# Patient Record
Sex: Male | Born: 1990 | Race: White | Hispanic: No | Marital: Single | Smoking: Current some day smoker
Health system: Southern US, Community
[De-identification: ages and names within clinical notes are randomized; demographics above are authoritative.]

## PROBLEM LIST (undated history)

## (undated) DIAGNOSIS — S31119A Laceration without foreign body of abdominal wall, unspecified quadrant without penetration into peritoneal cavity, initial encounter: Secondary | ICD-10-CM

## (undated) DIAGNOSIS — F319 Bipolar disorder, unspecified: Secondary | ICD-10-CM

## (undated) HISTORY — PX: APPENDECTOMY: SHX54

## (undated) HISTORY — PX: HAND SURGERY: SHX662

## (undated) HISTORY — PX: OTHER SURGICAL HISTORY: SHX169

## (undated) HISTORY — PX: EXPLORATORY LAPAROTOMY: SUR591

---

## 2013-12-26 ENCOUNTER — Emergency Department (HOSPITAL_BASED_OUTPATIENT_CLINIC_OR_DEPARTMENT_OTHER)
Admission: EM | Admit: 2013-12-26 | Discharge: 2013-12-27 | Disposition: A | Discharge status: Home / Self Care | Payer: Self-pay | Attending: Emergency Medicine | Admitting: Emergency Medicine

## 2013-12-26 ENCOUNTER — Encounter (HOSPITAL_BASED_OUTPATIENT_CLINIC_OR_DEPARTMENT_OTHER): Payer: Self-pay | Admitting: Emergency Medicine

## 2013-12-26 DIAGNOSIS — R6889 Other general symptoms and signs: Secondary | ICD-10-CM

## 2013-12-26 DIAGNOSIS — IMO0001 Reserved for inherently not codable concepts without codable children: Secondary | ICD-10-CM | POA: Insufficient documentation

## 2013-12-26 DIAGNOSIS — J111 Influenza due to unidentified influenza virus with other respiratory manifestations: Secondary | ICD-10-CM | POA: Insufficient documentation

## 2013-12-26 MED ORDER — ACETAMINOPHEN 325 MG PO TABS
650.0000 mg | ORAL_TABLET | Freq: Once | ORAL | Status: AC
  Administered 2013-12-26: 650 mg via ORAL
  Filled 2013-12-26: qty 2

## 2013-12-26 NOTE — ED Notes (Addendum)
Fever, cough, nasal congestion, headache, brown sputum and body aches x 3 days. Last Tylenol was 3.5 hours ago.

## 2013-12-27 MED ORDER — HYDROCOD POLST-CHLORPHEN POLST 10-8 MG/5ML PO LQCR
5.0000 mL | Freq: Once | ORAL | Status: AC
  Administered 2013-12-27: 5 mL via ORAL
  Filled 2013-12-27: qty 5

## 2013-12-27 MED ORDER — HYDROCOD POLST-CHLORPHEN POLST 10-8 MG/5ML PO LQCR: 5.0000 mL | Freq: Two times a day (BID) | ORAL | Status: DC | PRN

## 2013-12-27 NOTE — ED Provider Notes (Signed)
CSN: [id number]     Arrival date & time 12/26/13  2050 History   First MD Initiated Contact with Patient 12/27/13 0016     Chief Complaint  Patient presents with  . Fever      Patient is a 23 y.o. male presenting with flu symptoms. The history is provided by the patient.  Influenza Presenting symptoms: cough, diarrhea, fatigue, fever, headache, myalgias and sore throat   Presenting symptoms: no shortness of breath and no vomiting   Severity:  Moderate Onset quality:  Gradual Duration:  3 days Progression:  Worsening Chronicity:  New Relieved by:  Nothing Worsened by:  Nothing tried Associated symptoms: chills, decreased appetite and nasal congestion   Associated symptoms: no neck stiffness    No foreign travel reported   PMH - none Past Surgical History  Procedure Laterality Date  . Appendectomy     No family history on file. History  Substance Use Topics  . Smoking status: Never Smoker   . Smokeless tobacco: Not on file  . Alcohol Use: No    Review of Systems  Constitutional: Positive for fever, chills, fatigue and decreased appetite.  HENT: Positive for congestion and sore throat.   Respiratory: Positive for cough. Negative for shortness of breath.   Gastrointestinal: Positive for diarrhea. Negative for vomiting.  Musculoskeletal: Positive for myalgias. Negative for neck stiffness.  Neurological: Positive for headaches. Negative for weakness.  All other systems reviewed and are negative.      Allergies  Review of patient's allergies indicates no known allergies.  Home Medications   Current Outpatient Rx  Name  Route  Sig  Dispense  Refill  . chlorpheniramine-HYDROcodone (TUSSIONEX PENNKINETIC ER) 10-8 MG/5ML LQCR   Oral   Take 5 mLs by mouth every 12 (twelve) hours as needed for cough.   480 mL   0    BP 122/76  Pulse 87  Temp(Src) 98.5 F (36.9 C) (Oral)  Resp 16  Ht 5\' 10"  (1.778 m)  Wt 210 lb (95.255 kg)  BMI 30.13 kg/m2  SpO2  98% Physical Exam CONSTITUTIONAL: Well developed/well nourished HEAD: Normocephalic/atraumatic EYES: EOMI/PERRL ENMT: Mucous membranes moist, nasal congestion, uvula midline, pharynx erythematous, normal phonation NECK: supple no meningeal signs SPINE:entire spine nontender CV: S1/S2 noted, no murmurs/rubs/gallops noted LUNGS: Lungs are clear to auscultation bilaterally, no apparent distress ABDOMEN: soft, nontender, no rebound or guarding GU:no cva tenderness NEURO: Pt is awake/alert, moves all extremitiesx4 EXTREMITIES: pulses normal, full ROM, no rash noted SKIN: warm, color normal PSYCH: no abnormalities of mood noted  ED Course  Procedures   Suspect influenza Lung sounds clear, defer imaging/labs Pt is well appearing nontoxic He reports HA only when he coughs, no signs of meningitis Advised need for rest/fluids, and will give tussionex for cough We discussed strict return precautions  Medications  acetaminophen (TYLENOL) tablet 650 mg (650 mg Oral Given 12/26/13 2104)  chlorpheniramine-HYDROcodone (TUSSIONEX) 10-8 MG/5ML suspension 5 mL (5 mLs Oral Given 12/27/13 0117)     MDM   Final diagnoses:  Flu-like symptoms    Nursing notes including past medical history and social history reviewed and considered in documentation     Joya Gaskinsonald W Eusevio Schriver, MD 12/27/13 [phone]

## 2016-05-30 ENCOUNTER — Encounter (HOSPITAL_BASED_OUTPATIENT_CLINIC_OR_DEPARTMENT_OTHER): Payer: Self-pay | Admitting: *Deleted

## 2016-05-30 ENCOUNTER — Emergency Department (HOSPITAL_BASED_OUTPATIENT_CLINIC_OR_DEPARTMENT_OTHER): Payer: Medicaid Other

## 2016-05-30 DIAGNOSIS — M7989 Other specified soft tissue disorders: Secondary | ICD-10-CM | POA: Insufficient documentation

## 2016-05-30 DIAGNOSIS — Z5321 Procedure and treatment not carried out due to patient leaving prior to being seen by health care provider: Secondary | ICD-10-CM | POA: Insufficient documentation

## 2016-05-30 LAB — BASIC METABOLIC PANEL
Anion gap: 7 (ref 5–15)
BUN: 8 mg/dL (ref 6–20)
CALCIUM: 8.6 mg/dL — AB (ref 8.9–10.3)
CO2: 26 mmol/L (ref 22–32)
CREATININE: 0.96 mg/dL (ref 0.61–1.24)
Chloride: 107 mmol/L (ref 101–111)
Glucose, Bld: 109 mg/dL — ABNORMAL HIGH (ref 65–99)
Potassium: 3.5 mmol/L (ref 3.5–5.1)
Sodium: 140 mmol/L (ref 135–145)

## 2016-05-30 LAB — CBC
HCT: 40.4 % (ref 39.0–52.0)
HEMOGLOBIN: 13.7 g/dL (ref 13.0–17.0)
MCH: 28.1 pg (ref 26.0–34.0)
MCHC: 33.9 g/dL (ref 30.0–36.0)
MCV: 83 fL (ref 78.0–100.0)
PLATELETS: 211 10*3/uL (ref 150–400)
RBC: 4.87 MIL/uL (ref 4.22–5.81)
RDW: 13.5 % (ref 11.5–15.5)
WBC: 9.1 10*3/uL (ref 4.0–10.5)

## 2016-05-30 LAB — TROPONIN I

## 2016-05-30 NOTE — ED Triage Notes (Signed)
Pt reports bilateral hand swelling, feet, and facial swelling. He also states chest tightness and shortness of breath.

## 2016-05-31 ENCOUNTER — Emergency Department (HOSPITAL_BASED_OUTPATIENT_CLINIC_OR_DEPARTMENT_OTHER)
Admission: EM | Admit: 2016-05-31 | Discharge: 2016-05-31 | Disposition: A | Discharge status: Home / Self Care | Payer: Medicaid Other | Attending: Dermatology | Admitting: Dermatology

## 2016-05-31 NOTE — ED Notes (Signed)
Called pt twice to bring back to room; no answer.

## 2016-07-23 ENCOUNTER — Encounter (HOSPITAL_BASED_OUTPATIENT_CLINIC_OR_DEPARTMENT_OTHER): Payer: Self-pay | Admitting: Emergency Medicine

## 2016-07-23 ENCOUNTER — Emergency Department (HOSPITAL_BASED_OUTPATIENT_CLINIC_OR_DEPARTMENT_OTHER): Payer: Medicaid Other

## 2016-07-23 ENCOUNTER — Emergency Department (HOSPITAL_BASED_OUTPATIENT_CLINIC_OR_DEPARTMENT_OTHER)
Admission: EM | Admit: 2016-07-23 | Discharge: 2016-07-24 | Disposition: A | Discharge status: Home / Self Care | Payer: Medicaid Other | Attending: Emergency Medicine | Admitting: Emergency Medicine

## 2016-07-23 DIAGNOSIS — Y939 Activity, unspecified: Secondary | ICD-10-CM | POA: Insufficient documentation

## 2016-07-23 DIAGNOSIS — T148 Other injury of unspecified body region: Secondary | ICD-10-CM | POA: Insufficient documentation

## 2016-07-23 DIAGNOSIS — Y999 Unspecified external cause status: Secondary | ICD-10-CM | POA: Diagnosis not present

## 2016-07-23 DIAGNOSIS — S53402A Unspecified sprain of left elbow, initial encounter: Secondary | ICD-10-CM | POA: Insufficient documentation

## 2016-07-23 DIAGNOSIS — S46912A Strain of unspecified muscle, fascia and tendon at shoulder and upper arm level, left arm, initial encounter: Secondary | ICD-10-CM

## 2016-07-23 DIAGNOSIS — Y929 Unspecified place or not applicable: Secondary | ICD-10-CM | POA: Diagnosis not present

## 2016-07-23 DIAGNOSIS — S59902A Unspecified injury of left elbow, initial encounter: Secondary | ICD-10-CM | POA: Diagnosis present

## 2016-07-23 DIAGNOSIS — T148XXA Other injury of unspecified body region, initial encounter: Secondary | ICD-10-CM
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
## 2016-07-24 NOTE — Discharge Instructions (Signed)
Your x-rays show no fracture or joint fluid.  Apply ice several times per day.  Tylenol or motrin for pain

## 2017-02-07 ENCOUNTER — Emergency Department (HOSPITAL_BASED_OUTPATIENT_CLINIC_OR_DEPARTMENT_OTHER): Payer: Medicaid Other

## 2017-02-07 ENCOUNTER — Encounter (HOSPITAL_BASED_OUTPATIENT_CLINIC_OR_DEPARTMENT_OTHER): Payer: Self-pay | Admitting: *Deleted

## 2017-02-07 ENCOUNTER — Inpatient Hospital Stay (HOSPITAL_BASED_OUTPATIENT_CLINIC_OR_DEPARTMENT_OTHER)
Admission: EM | Admit: 2017-02-07 | Discharge: 2017-02-10 | DRG: 603 | Disposition: A | Discharge status: Home / Self Care | Payer: Medicaid Other | Attending: Family Medicine | Admitting: Family Medicine

## 2017-02-07 DIAGNOSIS — L0390 Cellulitis, unspecified: Secondary | ICD-10-CM | POA: Diagnosis present

## 2017-02-07 DIAGNOSIS — L03115 Cellulitis of right lower limb: Secondary | ICD-10-CM | POA: Diagnosis present

## 2017-02-07 DIAGNOSIS — D649 Anemia, unspecified: Secondary | ICD-10-CM | POA: Diagnosis present

## 2017-02-07 DIAGNOSIS — E876 Hypokalemia: Secondary | ICD-10-CM | POA: Diagnosis present

## 2017-02-07 DIAGNOSIS — D72829 Elevated white blood cell count, unspecified: Secondary | ICD-10-CM | POA: Diagnosis present

## 2017-02-07 DIAGNOSIS — Z9049 Acquired absence of other specified parts of digestive tract: Secondary | ICD-10-CM

## 2017-02-07 DIAGNOSIS — Z8614 Personal history of Methicillin resistant Staphylococcus aureus infection: Secondary | ICD-10-CM

## 2017-02-07 HISTORY — DX: Laceration without foreign body of abdominal wall, unspecified quadrant without penetration into peritoneal cavity, initial encounter: S31.119A

## 2017-02-07 HISTORY — DX: Bipolar disorder, unspecified: F31.9

## 2017-02-07 LAB — CBC WITH DIFFERENTIAL/PLATELET
Basophils Absolute: 0 10*3/uL (ref 0.0–0.1)
Basophils Relative: 0 %
Eosinophils Absolute: 0.1 10*3/uL (ref 0.0–0.7)
Eosinophils Relative: 1 %
HCT: 38.5 % — ABNORMAL LOW (ref 39.0–52.0)
Hemoglobin: 13.1 g/dL (ref 13.0–17.0)
Lymphocytes Relative: 22 %
Lymphs Abs: 2.5 10*3/uL (ref 0.7–4.0)
MCH: 27 pg (ref 26.0–34.0)
MCHC: 34 g/dL (ref 30.0–36.0)
MCV: 79.4 fL (ref 78.0–100.0)
Monocytes Absolute: 1.2 10*3/uL — ABNORMAL HIGH (ref 0.1–1.0)
Monocytes Relative: 11 %
Neutro Abs: 7.7 10*3/uL (ref 1.7–7.7)
Neutrophils Relative %: 66 %
Platelets: 218 10*3/uL (ref 150–400)
RBC: 4.85 MIL/uL (ref 4.22–5.81)
RDW: 13.4 % (ref 11.5–15.5)
WBC: 11.5 10*3/uL — ABNORMAL HIGH (ref 4.0–10.5)

## 2017-02-07 LAB — BASIC METABOLIC PANEL
Anion gap: 9 (ref 5–15)
BUN: 6 mg/dL (ref 6–20)
CO2: 27 mmol/L (ref 22–32)
Calcium: 9.1 mg/dL (ref 8.9–10.3)
Chloride: 98 mmol/L — ABNORMAL LOW (ref 101–111)
Creatinine, Ser: 0.85 mg/dL (ref 0.61–1.24)
GFR calc Af Amer: >60 mL/min (ref >60)
GFR calc non Af Amer: >60 mL/min (ref >60)
Glucose, Bld: 95 mg/dL (ref 65–99)
Potassium: 3.1 mmol/L — ABNORMAL LOW (ref 3.5–5.1)
Sodium: 134 mmol/L — ABNORMAL LOW (ref 135–145)

## 2017-02-07 MED ORDER — VANCOMYCIN HCL IN DEXTROSE 1-5 GM/200ML-% IV SOLN
1000.0000 mg | Freq: Once | INTRAVENOUS | Status: AC
  Administered 2017-02-07: 1000 mg via INTRAVENOUS
  Filled 2017-02-07: qty 200

## 2017-02-07 MED ORDER — MORPHINE SULFATE (PF) 4 MG/ML IV SOLN
4.0000 mg | Freq: Once | INTRAVENOUS | Status: AC
  Administered 2017-02-07: 4 mg via INTRAVENOUS
  Filled 2017-02-07: qty 1

## 2017-02-07 MED ORDER — SODIUM CHLORIDE 0.9 % IV BOLUS (SEPSIS)
1000.0000 mL | Freq: Once | INTRAVENOUS | Status: AC
  Administered 2017-02-07: 1000 mL via INTRAVENOUS
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
## 2017-02-08 DIAGNOSIS — D72829 Elevated white blood cell count, unspecified: Secondary | ICD-10-CM | POA: Diagnosis present

## 2017-02-08 DIAGNOSIS — L03115 Cellulitis of right lower limb: Secondary | ICD-10-CM | POA: Diagnosis present

## 2017-02-08 DIAGNOSIS — E876 Hypokalemia: Secondary | ICD-10-CM | POA: Diagnosis present

## 2017-02-08 DIAGNOSIS — L0390 Cellulitis, unspecified: Secondary | ICD-10-CM | POA: Diagnosis present

## 2017-02-08 LAB — CBC WITH DIFFERENTIAL/PLATELET
BASOS PCT: 0 %
Basophils Absolute: 0 10*3/uL (ref 0.0–0.1)
EOS ABS: 0.2 10*3/uL (ref 0.0–0.7)
Eosinophils Relative: 2 %
HCT: 36.6 % — ABNORMAL LOW (ref 39.0–52.0)
HEMOGLOBIN: 12.1 g/dL — AB (ref 13.0–17.0)
Lymphocytes Relative: 32 %
Lymphs Abs: 2.8 10*3/uL (ref 0.7–4.0)
MCH: 25.9 pg — ABNORMAL LOW (ref 26.0–34.0)
MCHC: 33.1 g/dL (ref 30.0–36.0)
MCV: 78.2 fL (ref 78.0–100.0)
Monocytes Absolute: 0.9 10*3/uL (ref 0.1–1.0)
Monocytes Relative: 10 %
NEUTROS ABS: 4.8 10*3/uL (ref 1.7–7.7)
Neutrophils Relative %: 56 %
Platelets: 201 10*3/uL (ref 150–400)
RBC: 4.68 MIL/uL (ref 4.22–5.81)
RDW: 13.5 % (ref 11.5–15.5)
WBC: 8.6 10*3/uL (ref 4.0–10.5)

## 2017-02-08 LAB — BASIC METABOLIC PANEL
Anion gap: 8 (ref 5–15)
BUN: 7 mg/dL (ref 6–20)
CO2: 27 mmol/L (ref 22–32)
Calcium: 8.6 mg/dL — ABNORMAL LOW (ref 8.9–10.3)
Chloride: 101 mmol/L (ref 101–111)
Creatinine, Ser: 0.72 mg/dL (ref 0.61–1.24)
Glucose, Bld: 92 mg/dL (ref 65–99)
POTASSIUM: 3.4 mmol/L — AB (ref 3.5–5.1)
SODIUM: 136 mmol/L (ref 135–145)

## 2017-02-08 LAB — MRSA PCR SCREENING: MRSA by PCR: NEGATIVE

## 2017-02-08 MED ORDER — MORPHINE SULFATE (PF) 4 MG/ML IV SOLN
4.0000 mg | Freq: Once | INTRAVENOUS | Status: AC
  Administered 2017-02-08: 4 mg via INTRAVENOUS
  Filled 2017-02-08: qty 1

## 2017-02-08 MED ORDER — ONDANSETRON HCL 4 MG/2ML IJ SOLN: 4.0000 mg | Freq: Four times a day (QID) | INTRAMUSCULAR | Status: DC | PRN

## 2017-02-08 MED ORDER — PREMIER PROTEIN SHAKE
11.0000 [oz_av] | Freq: Two times a day (BID) | ORAL | Status: DC
  Administered 2017-02-08 – 2017-02-10 (×5): 11 [oz_av] via ORAL
  Filled 2017-02-08 (×6): qty 325.31

## 2017-02-08 MED ORDER — ENOXAPARIN SODIUM 40 MG/0.4ML ~~LOC~~ SOLN
40.0000 mg | SUBCUTANEOUS | Status: DC
  Administered 2017-02-08 – 2017-02-10 (×3): 40 mg via SUBCUTANEOUS
  Filled 2017-02-08 (×3): qty 0.4

## 2017-02-08 MED ORDER — POTASSIUM CHLORIDE CRYS ER 20 MEQ PO TBCR
20.0000 meq | EXTENDED_RELEASE_TABLET | Freq: Once | ORAL | Status: AC
  Administered 2017-02-08: 20 meq via ORAL
  Filled 2017-02-08: qty 1

## 2017-02-08 MED ORDER — ACETAMINOPHEN 650 MG RE SUPP: 650.0000 mg | Freq: Four times a day (QID) | RECTAL | Status: DC | PRN

## 2017-02-08 MED ORDER — ACETAMINOPHEN 325 MG PO TABS: 650.0000 mg | ORAL_TABLET | Freq: Four times a day (QID) | ORAL | Status: DC | PRN

## 2017-02-08 MED ORDER — POTASSIUM CHLORIDE CRYS ER 20 MEQ PO TBCR
40.0000 meq | EXTENDED_RELEASE_TABLET | ORAL | Status: AC
Start: 2017-02-08 — End: 2017-02-08
  Administered 2017-02-08: 40 meq via ORAL
  Filled 2017-02-08: qty 2

## 2017-02-08 MED ORDER — HYDROCODONE-ACETAMINOPHEN 5-325 MG PO TABS
1.0000 | ORAL_TABLET | ORAL | Status: DC | PRN
  Administered 2017-02-08 (×2): 1 via ORAL
  Administered 2017-02-08 – 2017-02-09 (×2): 2 via ORAL
  Filled 2017-02-08: qty 2
  Filled 2017-02-08 (×2): qty 1
  Filled 2017-02-08: qty 2

## 2017-02-08 MED ORDER — VANCOMYCIN HCL IN DEXTROSE 1-5 GM/200ML-% IV SOLN
1000.0000 mg | Freq: Three times a day (TID) | INTRAVENOUS | Status: DC
  Administered 2017-02-08 – 2017-02-10 (×7): 1000 mg via INTRAVENOUS
  Filled 2017-02-08 (×7): qty 200

## 2017-02-08 MED ORDER — ONDANSETRON HCL 4 MG PO TABS: 4.0000 mg | ORAL_TABLET | Freq: Four times a day (QID) | ORAL | Status: DC | PRN
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
## 2017-02-08 NOTE — ED Notes (Addendum)
Pt's mother left contact info: Marcelle Smiling - cell - [phone]

## 2017-02-09 DIAGNOSIS — L03115 Cellulitis of right lower limb: Principal | ICD-10-CM

## 2017-02-09 DIAGNOSIS — E876 Hypokalemia: Secondary | ICD-10-CM

## 2017-02-09 DIAGNOSIS — D72829 Elevated white blood cell count, unspecified: Secondary | ICD-10-CM

## 2017-02-09 LAB — CBC
HEMATOCRIT: 39.1 % (ref 39.0–52.0)
HEMOGLOBIN: 12.7 g/dL — AB (ref 13.0–17.0)
MCH: 26.5 pg (ref 26.0–34.0)
MCHC: 32.5 g/dL (ref 30.0–36.0)
MCV: 81.6 fL (ref 78.0–100.0)
Platelets: 227 10*3/uL (ref 150–400)
RBC: 4.79 MIL/uL (ref 4.22–5.81)
RDW: 13.3 % (ref 11.5–15.5)
WBC: 7.8 10*3/uL (ref 4.0–10.5)

## 2017-02-09 LAB — BASIC METABOLIC PANEL
ANION GAP: 7 (ref 5–15)
BUN: 12 mg/dL (ref 6–20)
CALCIUM: 8.8 mg/dL — AB (ref 8.9–10.3)
CO2: 28 mmol/L (ref 22–32)
Chloride: 105 mmol/L (ref 101–111)
Creatinine, Ser: 0.68 mg/dL (ref 0.61–1.24)
Glucose, Bld: 112 mg/dL — ABNORMAL HIGH (ref 65–99)
POTASSIUM: 4.2 mmol/L (ref 3.5–5.1)
Sodium: 140 mmol/L (ref 135–145)

## 2017-02-09 LAB — HIV ANTIBODY (ROUTINE TESTING W REFLEX): HIV SCREEN 4TH GENERATION: NONREACTIVE

## 2017-02-09 MED ORDER — MUPIROCIN 2 % EX OINT
TOPICAL_OINTMENT | Freq: Two times a day (BID) | CUTANEOUS | Status: DC
  Administered 2017-02-09 – 2017-02-10 (×3): via TOPICAL
  Filled 2017-02-09: qty 22

## 2017-02-09 MED ORDER — MORPHINE SULFATE (PF) 4 MG/ML IV SOLN
2.0000 mg | INTRAVENOUS | Status: DC | PRN
  Administered 2017-02-09 (×4): 2 mg via INTRAVENOUS
  Filled 2017-02-09 (×4): qty 1

## 2017-02-09 MED ORDER — MUPIROCIN 2 % EX OINT: TOPICAL_OINTMENT | Freq: Two times a day (BID) | CUTANEOUS | Status: DC
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
## 2017-02-10 MED ORDER — CLINDAMYCIN HCL 300 MG PO CAPS: 300.0000 mg | ORAL_CAPSULE | Freq: Three times a day (TID) | ORAL | 0 refills | Status: AC

## 2017-02-10 MED ORDER — MUPIROCIN 2 % EX OINT: TOPICAL_OINTMENT | Freq: Two times a day (BID) | CUTANEOUS | 0 refills | Status: AC
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
## 2019-02-19 ENCOUNTER — Encounter (HOSPITAL_BASED_OUTPATIENT_CLINIC_OR_DEPARTMENT_OTHER): Payer: Self-pay | Admitting: *Deleted

## 2019-02-19 ENCOUNTER — Emergency Department (HOSPITAL_BASED_OUTPATIENT_CLINIC_OR_DEPARTMENT_OTHER)
Admission: EM | Admit: 2019-02-19 | Discharge: 2019-02-19 | Disposition: A | Discharge status: Home / Self Care | Payer: Self-pay | Attending: Emergency Medicine | Admitting: Emergency Medicine

## 2019-02-19 ENCOUNTER — Other Ambulatory Visit: Payer: Self-pay

## 2019-02-19 DIAGNOSIS — Z79899 Other long term (current) drug therapy: Secondary | ICD-10-CM | POA: Insufficient documentation

## 2019-02-19 DIAGNOSIS — Z5189 Encounter for other specified aftercare: Secondary | ICD-10-CM | POA: Insufficient documentation

## 2019-02-19 DIAGNOSIS — M65042 Abscess of tendon sheath, left hand: Secondary | ICD-10-CM | POA: Insufficient documentation

## 2019-02-19 DIAGNOSIS — F17210 Nicotine dependence, cigarettes, uncomplicated: Secondary | ICD-10-CM | POA: Insufficient documentation

## 2019-02-19 MED ORDER — AMOXICILLIN-POT CLAVULANATE 875-125 MG PO TABS: 1.0000 | ORAL_TABLET | Freq: Two times a day (BID) | ORAL | 0 refills | Status: AC

## 2019-02-19 NOTE — ED Triage Notes (Signed)
Pt had surgery on both middle fingers for infection 1 week ago.  Pt states he didn't take antibiotics. Right finger is healing well but left middle finger very swollen. Pt had procedure done at [facility]

## 2019-02-19 NOTE — Discharge Instructions (Signed)
Take Augmentin until completed.  It is important that you follow-up with Dr. Rolly Salter office for further evaluation and treatment of your hand problem.  Please return the emergency department or go to [facility] emergency department if you develop any increasing pain, redness, swelling, drainage, fever.

## 2019-02-19 NOTE — ED Notes (Signed)
Pt verbalized understanding of dc instructions.
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
## 2020-05-30 ENCOUNTER — Telehealth: Payer: Self-pay | Admitting: Pediatric Intensive Care

## 2020-05-30 NOTE — Telephone Encounter (Signed)
Attempted to call pt for [facility] intake, got a message that stated try call later Will call tomorrow am

## 2020-05-30 NOTE — Telephone Encounter (Signed)
Call to client ID x2. Client is a referral from [facility] case worker. Client states that he would "like to get clean" and desires referral for a MAT clinic. Client states that his wife is having some health issues and that he has had legal issues. He feel that this is a good time for a referral. CN advised that she would call the OUD clinic for referral. Client states that he would be home by 1615 today to receive a call.  Shann Medal RN BSN CNP [phone]

## 2020-05-31 NOTE — Telephone Encounter (Signed)
Have not been able to reach pt, will f/u w/ victoria mon

## 2020-06-03 NOTE — Telephone Encounter (Signed)
I have tried both ph#'s twice today, will call again tomorrow and touch base w/ victoria

## 2022-05-29 ENCOUNTER — Other Ambulatory Visit: Payer: Self-pay

## 2022-05-29 ENCOUNTER — Encounter (HOSPITAL_BASED_OUTPATIENT_CLINIC_OR_DEPARTMENT_OTHER): Payer: Self-pay | Admitting: Urology

## 2022-05-29 ENCOUNTER — Emergency Department (HOSPITAL_BASED_OUTPATIENT_CLINIC_OR_DEPARTMENT_OTHER)
Admission: EM | Admit: 2022-05-29 | Discharge: 2022-05-29 | Disposition: A | Discharge status: Home / Self Care | Payer: No Typology Code available for payment source | Attending: Emergency Medicine | Admitting: Emergency Medicine

## 2022-05-29 ENCOUNTER — Emergency Department (HOSPITAL_BASED_OUTPATIENT_CLINIC_OR_DEPARTMENT_OTHER): Payer: No Typology Code available for payment source

## 2022-05-29 DIAGNOSIS — S81812A Laceration without foreign body, left lower leg, initial encounter: Secondary | ICD-10-CM | POA: Diagnosis not present

## 2022-05-29 DIAGNOSIS — S8992XA Unspecified injury of left lower leg, initial encounter: Secondary | ICD-10-CM | POA: Diagnosis present

## 2022-05-29 DIAGNOSIS — W293XXA Contact with powered garden and outdoor hand tools and machinery, initial encounter: Secondary | ICD-10-CM | POA: Insufficient documentation

## 2022-05-29 DIAGNOSIS — Z23 Encounter for immunization: Secondary | ICD-10-CM | POA: Diagnosis not present

## 2022-05-29 MED ORDER — TETANUS-DIPHTH-ACELL PERTUSSIS 5-2.5-18.5 LF-MCG/0.5 IM SUSY
0.5000 mL | PREFILLED_SYRINGE | Freq: Once | INTRAMUSCULAR | Status: AC
  Administered 2022-05-29: 0.5 mL via INTRAMUSCULAR
  Filled 2022-05-29: qty 0.5

## 2022-05-29 MED ORDER — LIDOCAINE-EPINEPHRINE (PF) 2 %-1:200000 IJ SOLN
20.0000 mL | Freq: Once | INTRAMUSCULAR | Status: DC
  Filled 2022-05-29: qty 20

## 2022-05-29 MED ORDER — CEFAZOLIN SODIUM 1 G IM
1.0000 g | Freq: Once | INTRAMUSCULAR | Status: AC
Start: 2022-05-29 — End: 2022-05-29
  Administered 2022-05-29: 1 g via INTRAMUSCULAR
  Filled 2022-05-29: qty 10

## 2022-05-29 MED ORDER — CEPHALEXIN 500 MG PO CAPS: 500.0000 mg | ORAL_CAPSULE | Freq: Four times a day (QID) | ORAL | 0 refills | Status: AC

## 2022-05-29 NOTE — ED Notes (Addendum)
XR at bedside

## 2022-05-29 NOTE — ED Notes (Signed)
ED Provider at bedside. 
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
## 2022-05-29 NOTE — ED Provider Notes (Signed)
[facility] EMERGENCY DEPARTMENT Provider Note   CSN: [id number] Arrival date & time: 05/29/22  1236     History  Chief Complaint  Patient presents with   Laceration    Darren Cook is a 31 y.o. male with past medical history significant for previous bipolar disorder presents with concern for left lower leg laceration x2 after getting cut with chainsaw blade earlier today.  Patient reports that he lost control of chainsaw injury to his leg back but sustained 2 lacerations.  He does not know when his last tetanus shot was.  He denies any numbness, tingling of the affected extremity, bleeding is controlled with pressure wrap on arrival.      Laceration      Home Medications Prior to Admission medications   Medication Sig Start Date End Date Taking? Authorizing Provider  cephALEXin (KEFLEX) 500 MG capsule Take 1 capsule (500 mg total) by mouth 4 (four) times daily. 05/29/22  Yes Seville Brick H, PA-C  amoxicillin-clavulanate (AUGMENTIN) 875-125 MG tablet Take 1 tablet by mouth every 12 (twelve) hours. 02/19/19   Law, Waylan Boga, PA-C  clindamycin (CLEOCIN) 300 MG capsule Take 1 capsule (300 mg total) by mouth 3 (three) times daily. 02/10/17   Tyrone Nine, MD  mupirocin ointment (BACTROBAN) 2 % Apply topically 2 (two) times daily. 02/10/17   Tyrone Nine, MD      Allergies    Other and Vancomycin    Review of Systems   Review of Systems  Skin:  Positive for wound.  All other systems reviewed and are negative.   Physical Exam Updated Vital Signs BP 130/84   Pulse 76   Temp 98.7 F (37.1 C) (Oral)   Resp 17   Ht 5\' 10"  (1.778 m)   Wt 83.9 kg   SpO2 99%   BMI 26.54 kg/m  Physical Exam Vitals and nursing note reviewed.  Constitutional:      General: He is not in acute distress.    Appearance: Normal appearance.  HENT:     Head: Normocephalic and atraumatic.  Eyes:     General:        Right eye: No discharge.        Left eye: No discharge.   Cardiovascular:     Rate and Rhythm: Normal rate and regular rhythm.     Pulses: Normal pulses.  Pulmonary:     Effort: Pulmonary effort is normal. No respiratory distress.  Musculoskeletal:        General: No deformity.     Comments: Intact strength to flexion, extension of the entire left lower extremity.  No evidence of exposed bone, patellar injury.  Some superficial muscle injury noted.  Deep irrigation and [place] exposure reveals what appears to be superficial tendinous injury, however patient with no noted deficits at this time.  Suspect possible minor shearing injury versus other occult tendinous injury.  No evidence of direct disruption to periosteum or open fracture noted.  Skin:    General: Skin is warm and dry.     Capillary Refill: Capillary refill takes less than 2 seconds.     Comments: 2 transverse jagged lacerations noted of the left anterior tibia inferior to left knee.  No foreign bodies noted.  See pictures for further evaluation.  Neurological:     Mental Status: He is alert and oriented to person, place, and time.  Psychiatric:        Mood and Affect: Mood normal.  Behavior: Behavior normal.          ED Results / Procedures / Treatments   Labs (all labs ordered are listed, but only abnormal results are displayed) Labs Reviewed - No data to display  EKG None  Radiology DG Tibia/Fibula Left  Result Date: 05/29/2022 CLINICAL DATA:  Chainsaw injury EXAM: LEFT TIBIA AND FIBULA - 2 VIEW COMPARISON:  None Available. FINDINGS: No evidence of fracture or other focal bone lesions. Enthesophytes of the anterior lower tibia and talus. Soft tissue irregularity of the anterior lower leg adjacent to the tibial tuberosity. IMPRESSION: No acute osseous abnormality. Electronically Signed   By: Allegra Lai M.D.   On: 05/29/2022 13:22    Procedures .Marland KitchenLaceration Repair  Date/Time: 05/29/2022 3:42 PM  Performed by: Olene Floss, PA-C Authorized by:  Olene Floss, PA-C   Consent:    Consent obtained:  Verbal   Consent given by:  Patient   Risks, benefits, and alternatives were discussed: yes     Risks discussed:  Infection, pain, poor cosmetic result, poor wound healing, need for additional repair, tendon damage and retained foreign body   Alternatives discussed:  No treatment Universal protocol:    Procedure explained and questions answered to patient or proxy's satisfaction: yes     Patient identity confirmed:  Verbally with patient Anesthesia:    Anesthesia method:  Local infiltration   Local anesthetic:  Lidocaine 2% WITH epi Laceration details:    Location:  Leg   Leg location:  L lower leg   Length (cm):  7.3   Depth (mm):  12 Exploration:    Hemostasis achieved with:  Direct pressure and epinephrine   Wound exploration: wound explored through full range of motion and entire depth of wound visualized     Wound extent comment:  Concern for possible injury left lower extremity   Contaminated: yes   Treatment:    Area cleansed with:  Povidone-iodine   Amount of cleaning:  Extensive   Irrigation solution:  Sterile saline   Irrigation volume:  Greater than 1 L   Irrigation method:  Pressure wash   Layers/structures repaired:  Deep subcutaneous Deep subcutaneous:    Suture size:  4-0   Suture material:  Vicryl   Suture technique:  Simple interrupted   Number of sutures:  3 Skin repair:    Repair method:  Sutures   Suture size:  4-0   Suture material:  Prolene   Suture technique:  Simple interrupted   Number of sutures:  11 Approximation:    Approximation:  Close Repair type:    Repair type:  Intermediate Post-procedure details:    Dressing:  Antibiotic ointment and non-adherent dressing   Procedure completion:  Tolerated .Marland KitchenLaceration Repair  Date/Time: 05/29/2022 3:45 PM  Performed by: Olene Floss, PA-C Authorized by: Olene Floss, PA-C   Consent:    Consent obtained:  Verbal    Consent given by:  Patient   Risks, benefits, and alternatives were discussed: yes     Risks discussed:  Pain, infection, need for additional repair, nerve damage, tendon damage, poor wound healing and poor cosmetic result   Alternatives discussed:  No treatment Universal protocol:    Procedure explained and questions answered to patient or proxy's satisfaction: yes     Patient identity confirmed:  Verbally with patient Anesthesia:    Anesthesia method:  Local infiltration   Local anesthetic:  Lidocaine 2% WITH epi Laceration details:    Location:  Leg  Leg location:  L lower leg   Length (cm):  6.4   Depth (mm):  6 Exploration:    Wound exploration: wound explored through full range of motion and entire depth of wound visualized     Contaminated: yes   Treatment:    Area cleansed with:  Povidone-iodine and saline   Amount of cleaning:  Extensive   Irrigation solution:  Sterile saline   Irrigation volume:  Greater than 1 L Skin repair:    Repair method:  Sutures   Suture size:  4-0   Suture material:  Prolene   Suture technique:  Simple interrupted   Number of sutures:  6 Approximation:    Approximation:  Close Repair type:    Repair type:  Intermediate Post-procedure details:    Dressing:  Antibiotic ointment and non-adherent dressing   Procedure completion:  Tolerated     Medications Ordered in ED Medications  lidocaine-EPINEPHrine (XYLOCAINE W/EPI) 2 %-1:200000 (PF) injection 20 mL (has no administration in time range)  Tdap (BOOSTRIX) injection 0.5 mL (0.5 mLs Intramuscular Given 05/29/22 1259)  ceFAZolin (ANCEF) injection 1 g (1 g Intramuscular Given 05/29/22 1507)    ED Course/ Medical Decision Making/ A&P                           Medical Decision Making Amount and/or Complexity of Data Reviewed Radiology: ordered.  Risk Prescription drug management.   This is an overall well-appearing 31 year old male who presents with concern for laceration to left lower  extremity with a chainsaw.  Number differential diagnosis includes open fracture, tendinous injury, fascial or muscular injury, retained foreign body, risk for infection.  Patient is not up-to-date on his tetanus.  On my exam patient with wound that is concerning for possible superficial tendon or partial tendon injury, please see pictures for further evaluation, no other foreign bodies noted, no evidence of disruption to periosteum or open fracture noted.  Large transverse wounds stacked right on top of each other as shown above.  I independently interpreted imaging including plain film tibia/fibula of the left leg which shows no evidence of fracture, dislocation, or retained foreign body. I agree with the radiologist interpretation.  Lacerations repaired after thorough irrigation and full exploration of wound, we will have patient follow-up with orthopedics despite no muscular deficits on exam today due to concern for possible occult tendon injury.  Patient with intact flexion extension at the knee and no sensory deficits noted.  Patient tolerated the pair without difficulty.  We will place him on Keflex, given shot of Ancef in the emergency department.  Tdap updated.  Patient discharged in stable condition, encouraged to return for suture removal in 7 to 10 days.    Final Clinical Impression(s) / ED Diagnoses Final diagnoses:  Laceration of left lower leg, initial encounter    Rx / DC Orders ED Discharge Orders          Ordered    cephALEXin (KEFLEX) 500 MG capsule  4 times daily        05/29/22 1455              Chaney Ingram, [place], PA-C 05/29/22 1546    Arby Barrette, MD 06/04/22 2125

## 2022-05-29 NOTE — ED Notes (Signed)
Pt wound cleaned and dressed with non stick pads and wrapped in guaze with ace bandage covering to keep in place.

## 2022-05-29 NOTE — Discharge Instructions (Addendum)
Please use Tylenol or ibuprofen for pain.  You may use 600 mg ibuprofen every 6 hours or 1000 mg of Tylenol every 6 hours.  You may choose to alternate between the 2.  This would be most effective.  Not to exceed 4 g of Tylenol within 24 hours.  Not to exceed 3200 mg ibuprofen 24 hours.  Take the entire course of antibiotics that we have prescribed.  Call the orthopedic doctor whose information I provided when you leave here today to try to schedule an appointment for wound recheck next week, and please return if you notice any signs of infection including worsening redness, pain, drainage.  You can use ibuprofen Tylenol for pain.  I recommend that you change the bandage once to twice daily with bacitracin and clean dressing.
